# Patient Record
Sex: Male | Born: 1935 | Race: White | Hispanic: No | Smoking: Never smoker
Health system: Southern US, Community
[De-identification: ages and names within clinical notes are randomized; demographics above are authoritative.]

## PROBLEM LIST (undated history)

## (undated) DIAGNOSIS — E669 Obesity, unspecified: Secondary | ICD-10-CM

## (undated) DIAGNOSIS — K648 Other hemorrhoids: Secondary | ICD-10-CM

## (undated) DIAGNOSIS — E785 Hyperlipidemia, unspecified: Secondary | ICD-10-CM

## (undated) DIAGNOSIS — E039 Hypothyroidism, unspecified: Secondary | ICD-10-CM

## (undated) DIAGNOSIS — K5730 Diverticulosis of large intestine without perforation or abscess without bleeding: Secondary | ICD-10-CM

## (undated) DIAGNOSIS — M179 Osteoarthritis of knee, unspecified: Secondary | ICD-10-CM

## (undated) DIAGNOSIS — M1710 Unilateral primary osteoarthritis, unspecified knee: Secondary | ICD-10-CM

## (undated) HISTORY — DX: Hyperlipidemia, unspecified: E78.5

## (undated) HISTORY — DX: Diverticulosis of large intestine without perforation or abscess without bleeding: K57.30

## (undated) HISTORY — DX: Osteoarthritis of knee, unspecified: M17.9

## (undated) HISTORY — DX: Other hemorrhoids: K64.8

## (undated) HISTORY — DX: Unilateral primary osteoarthritis, unspecified knee: M17.10

## (undated) HISTORY — DX: Obesity, unspecified: E66.9

## (undated) HISTORY — DX: Hypothyroidism, unspecified: E03.9

---

## 2003-07-11 ENCOUNTER — Encounter: Admission: RE | Admit: 2003-07-11 | Discharge: 2003-07-11 | Payer: Self-pay | Admitting: Internal Medicine

## 2008-06-23 ENCOUNTER — Ambulatory Visit: Payer: Self-pay | Admitting: Vascular Surgery

## 2008-12-23 ENCOUNTER — Ambulatory Visit: Payer: Self-pay | Admitting: Internal Medicine

## 2008-12-23 DIAGNOSIS — K5730 Diverticulosis of large intestine without perforation or abscess without bleeding: Secondary | ICD-10-CM

## 2008-12-23 HISTORY — DX: Diverticulosis of large intestine without perforation or abscess without bleeding: K57.30

## 2009-01-05 ENCOUNTER — Ambulatory Visit: Payer: Self-pay | Admitting: Internal Medicine

## 2009-05-01 ENCOUNTER — Ambulatory Visit: Payer: Self-pay | Admitting: Surgery

## 2009-10-03 ENCOUNTER — Encounter (INDEPENDENT_AMBULATORY_CARE_PROVIDER_SITE_OTHER): Payer: Self-pay | Admitting: Internal Medicine

## 2009-10-03 ENCOUNTER — Ambulatory Visit: Payer: Self-pay | Admitting: Internal Medicine

## 2009-10-03 ENCOUNTER — Ambulatory Visit: Payer: Self-pay | Admitting: Vascular Surgery

## 2009-10-03 ENCOUNTER — Inpatient Hospital Stay (HOSPITAL_COMMUNITY): Admission: EM | Admit: 2009-10-03 | Discharge: 2009-10-13 | Payer: Self-pay | Admitting: Emergency Medicine

## 2009-10-06 ENCOUNTER — Encounter: Payer: Self-pay | Admitting: Critical Care Medicine

## 2009-10-14 ENCOUNTER — Inpatient Hospital Stay (HOSPITAL_COMMUNITY): Admission: EM | Admit: 2009-10-14 | Discharge: 2009-10-20 | Payer: Self-pay | Admitting: Emergency Medicine

## 2009-10-17 ENCOUNTER — Encounter (HOSPITAL_BASED_OUTPATIENT_CLINIC_OR_DEPARTMENT_OTHER): Payer: Self-pay | Admitting: Internal Medicine

## 2009-12-15 ENCOUNTER — Inpatient Hospital Stay (HOSPITAL_COMMUNITY): Admission: AD | Admit: 2009-12-15 | Discharge: 2009-12-21 | Payer: Self-pay | Admitting: Internal Medicine

## 2009-12-16 ENCOUNTER — Encounter (INDEPENDENT_AMBULATORY_CARE_PROVIDER_SITE_OTHER): Payer: Self-pay | Admitting: Internal Medicine

## 2009-12-16 ENCOUNTER — Ambulatory Visit: Payer: Self-pay | Admitting: Vascular Surgery

## 2009-12-16 ENCOUNTER — Ambulatory Visit: Payer: Self-pay | Admitting: Infectious Diseases

## 2010-10-11 LAB — GLUCOSE, CAPILLARY
Glucose-Capillary: 104 mg/dL — ABNORMAL HIGH (ref 70–99)
Glucose-Capillary: 107 mg/dL — ABNORMAL HIGH (ref 70–99)
Glucose-Capillary: 108 mg/dL — ABNORMAL HIGH (ref 70–99)
Glucose-Capillary: 109 mg/dL — ABNORMAL HIGH (ref 70–99)
Glucose-Capillary: 109 mg/dL — ABNORMAL HIGH (ref 70–99)
Glucose-Capillary: 110 mg/dL — ABNORMAL HIGH (ref 70–99)
Glucose-Capillary: 110 mg/dL — ABNORMAL HIGH (ref 70–99)
Glucose-Capillary: 112 mg/dL — ABNORMAL HIGH (ref 70–99)
Glucose-Capillary: 112 mg/dL — ABNORMAL HIGH (ref 70–99)
Glucose-Capillary: 126 mg/dL — ABNORMAL HIGH (ref 70–99)
Glucose-Capillary: 130 mg/dL — ABNORMAL HIGH (ref 70–99)
Glucose-Capillary: 146 mg/dL — ABNORMAL HIGH (ref 70–99)

## 2010-10-11 LAB — CBC
HCT: 26.3 % — ABNORMAL LOW (ref 39.0–52.0)
HCT: 28.4 % — ABNORMAL LOW (ref 39.0–52.0)
HCT: 29.6 % — ABNORMAL LOW (ref 39.0–52.0)
HCT: 42.7 % (ref 39.0–52.0)
Hemoglobin: 10.2 g/dL — ABNORMAL LOW (ref 13.0–17.0)
Hemoglobin: 14 g/dL (ref 13.0–17.0)
Hemoglobin: 8.9 g/dL — ABNORMAL LOW (ref 13.0–17.0)
Hemoglobin: 9 g/dL — ABNORMAL LOW (ref 13.0–17.0)
Hemoglobin: 9.8 g/dL — ABNORMAL LOW (ref 13.0–17.0)
MCHC: 32.8 g/dL (ref 30.0–36.0)
MCHC: 34.3 g/dL (ref 30.0–36.0)
MCHC: 34.3 g/dL (ref 30.0–36.0)
MCHC: 34.3 g/dL (ref 30.0–36.0)
MCHC: 34.4 g/dL (ref 30.0–36.0)
MCHC: 34.4 g/dL (ref 30.0–36.0)
MCHC: 34.5 g/dL (ref 30.0–36.0)
MCV: 89.3 fL (ref 78.0–100.0)
MCV: 89.3 fL (ref 78.0–100.0)
MCV: 93.1 fL (ref 78.0–100.0)
Platelets: 174 10*3/uL (ref 150–400)
Platelets: 193 10*3/uL (ref 150–400)
RBC: 2.87 MIL/uL — ABNORMAL LOW (ref 4.22–5.81)
RBC: 2.95 MIL/uL — ABNORMAL LOW (ref 4.22–5.81)
RBC: 3.08 MIL/uL — ABNORMAL LOW (ref 4.22–5.81)
RBC: 3.18 MIL/uL — ABNORMAL LOW (ref 4.22–5.81)
RBC: 3.46 MIL/uL — ABNORMAL LOW (ref 4.22–5.81)
RBC: 4.58 MIL/uL (ref 4.22–5.81)
RDW: 13.7 % (ref 11.5–15.5)
RDW: 17.2 % — ABNORMAL HIGH (ref 11.5–15.5)
RDW: 17.3 % — ABNORMAL HIGH (ref 11.5–15.5)
WBC: 4.7 10*3/uL (ref 4.0–10.5)
WBC: 5.4 10*3/uL (ref 4.0–10.5)
WBC: 6.8 10*3/uL (ref 4.0–10.5)
WBC: 9.8 10*3/uL (ref 4.0–10.5)

## 2010-10-11 LAB — COMPREHENSIVE METABOLIC PANEL
ALT: 22 U/L (ref 0–53)
AST: 27 U/L (ref 0–37)
Albumin: 3 g/dL — ABNORMAL LOW (ref 3.5–5.2)
Alkaline Phosphatase: 37 U/L — ABNORMAL LOW (ref 39–117)
BUN: 44 mg/dL — ABNORMAL HIGH (ref 6–23)
CO2: 25 mEq/L (ref 19–32)
Calcium: 8.6 mg/dL (ref 8.4–10.5)
Chloride: 104 mEq/L (ref 96–112)
Creatinine, Ser: 2.82 mg/dL — ABNORMAL HIGH (ref 0.4–1.5)
GFR calc Af Amer: 27 mL/min — ABNORMAL LOW (ref >60)
GFR calc non Af Amer: 22 mL/min — ABNORMAL LOW (ref >60)
Glucose, Bld: 128 mg/dL — ABNORMAL HIGH (ref 70–99)
Potassium: 3.6 mEq/L (ref 3.5–5.1)
Sodium: 135 mEq/L (ref 135–145)
Total Bilirubin: 0.8 mg/dL (ref 0.3–1.2)
Total Protein: 7.3 g/dL (ref 6.0–8.3)

## 2010-10-11 LAB — BASIC METABOLIC PANEL
BUN: 40 mg/dL — ABNORMAL HIGH (ref 6–23)
CO2: 21 mEq/L (ref 19–32)
CO2: 22 mEq/L (ref 19–32)
CO2: 25 mEq/L (ref 19–32)
CO2: 26 mEq/L (ref 19–32)
CO2: 26 mEq/L (ref 19–32)
Calcium: 8 mg/dL — ABNORMAL LOW (ref 8.4–10.5)
Calcium: 8.3 mg/dL — ABNORMAL LOW (ref 8.4–10.5)
Calcium: 8.5 mg/dL (ref 8.4–10.5)
Calcium: 8.7 mg/dL (ref 8.4–10.5)
Chloride: 106 mEq/L (ref 96–112)
Chloride: 109 mEq/L (ref 96–112)
Creatinine, Ser: 1.43 mg/dL (ref 0.4–1.5)
Creatinine, Ser: 1.52 mg/dL — ABNORMAL HIGH (ref 0.4–1.5)
Creatinine, Ser: 2.49 mg/dL — ABNORMAL HIGH (ref 0.4–1.5)
GFR calc Af Amer: 31 mL/min — ABNORMAL LOW (ref >60)
GFR calc Af Amer: 45 mL/min — ABNORMAL LOW (ref >60)
GFR calc Af Amer: 55 mL/min — ABNORMAL LOW (ref >60)
GFR calc Af Amer: 56 mL/min — ABNORMAL LOW (ref >60)
GFR calc Af Amer: 59 mL/min — ABNORMAL LOW (ref >60)
GFR calc non Af Amer: 26 mL/min — ABNORMAL LOW (ref >60)
GFR calc non Af Amer: 37 mL/min — ABNORMAL LOW (ref >60)
GFR calc non Af Amer: 48 mL/min — ABNORMAL LOW (ref >60)
Glucose, Bld: 102 mg/dL — ABNORMAL HIGH (ref 70–99)
Glucose, Bld: 102 mg/dL — ABNORMAL HIGH (ref 70–99)
Glucose, Bld: 111 mg/dL — ABNORMAL HIGH (ref 70–99)
Potassium: 3.5 mEq/L (ref 3.5–5.1)
Potassium: 3.7 mEq/L (ref 3.5–5.1)
Potassium: 3.7 mEq/L (ref 3.5–5.1)
Sodium: 135 mEq/L (ref 135–145)
Sodium: 138 mEq/L (ref 135–145)
Sodium: 139 mEq/L (ref 135–145)
Sodium: 140 mEq/L (ref 135–145)

## 2010-10-11 LAB — CULTURE, BLOOD (ROUTINE X 2)
Culture: NO GROWTH
Culture: NO GROWTH

## 2010-10-11 LAB — VANCOMYCIN, TROUGH: Vancomycin Tr: 50.1 ug/mL (ref 10.0–20.0)

## 2010-10-18 LAB — CBC
HCT: 24.1 % — ABNORMAL LOW (ref 39.0–52.0)
HCT: 25.3 % — ABNORMAL LOW (ref 39.0–52.0)
HCT: 25.8 % — ABNORMAL LOW (ref 39.0–52.0)
HCT: 26 % — ABNORMAL LOW (ref 39.0–52.0)
HCT: 27.4 % — ABNORMAL LOW (ref 39.0–52.0)
Hemoglobin: 11 g/dL — ABNORMAL LOW (ref 13.0–17.0)
Hemoglobin: 8 g/dL — ABNORMAL LOW (ref 13.0–17.0)
Hemoglobin: 8.1 g/dL — ABNORMAL LOW (ref 13.0–17.0)
Hemoglobin: 8.7 g/dL — ABNORMAL LOW (ref 13.0–17.0)
Hemoglobin: 8.9 g/dL — ABNORMAL LOW (ref 13.0–17.0)
Hemoglobin: 9.1 g/dL — ABNORMAL LOW (ref 13.0–17.0)
MCHC: 32.3 g/dL (ref 30.0–36.0)
MCHC: 31.6 g/dL (ref 30.0–36.0)
MCHC: 33.3 g/dL (ref 30.0–36.0)
MCHC: 33.4 g/dL (ref 30.0–36.0)
MCHC: 33.5 g/dL (ref 30.0–36.0)
MCHC: 33.6 g/dL (ref 30.0–36.0)
MCHC: 34.1 g/dL (ref 30.0–36.0)
MCHC: 34.3 g/dL (ref 30.0–36.0)
MCHC: 34.5 g/dL (ref 30.0–36.0)
MCHC: 34.7 g/dL (ref 30.0–36.0)
MCHC: 34.8 g/dL (ref 30.0–36.0)
MCV: 83.4 fL (ref 78.0–100.0)
MCV: 83.5 fL (ref 78.0–100.0)
MCV: 83.9 fL (ref 78.0–100.0)
MCV: 84.6 fL (ref 78.0–100.0)
MCV: 84.7 fL (ref 78.0–100.0)
MCV: 84.8 fL (ref 78.0–100.0)
MCV: 84.8 fL (ref 78.0–100.0)
MCV: 85.3 fL (ref 78.0–100.0)
Platelets: 123 10*3/uL — ABNORMAL LOW (ref 150–400)
Platelets: 194 10*3/uL (ref 150–400)
Platelets: 100 10*3/uL — ABNORMAL LOW (ref 150–400)
Platelets: 104 10*3/uL — ABNORMAL LOW (ref 150–400)
Platelets: 305 10*3/uL (ref 150–400)
Platelets: 331 10*3/uL (ref 150–400)
Platelets: 407 10*3/uL — ABNORMAL HIGH (ref 150–400)
Platelets: 413 10*3/uL — ABNORMAL HIGH (ref 150–400)
RBC: 3.94 MIL/uL — ABNORMAL LOW (ref 4.22–5.81)
RBC: 2.79 MIL/uL — ABNORMAL LOW (ref 4.22–5.81)
RBC: 2.85 MIL/uL — ABNORMAL LOW (ref 4.22–5.81)
RBC: 2.88 MIL/uL — ABNORMAL LOW (ref 4.22–5.81)
RBC: 3.02 MIL/uL — ABNORMAL LOW (ref 4.22–5.81)
RBC: 3.04 MIL/uL — ABNORMAL LOW (ref 4.22–5.81)
RBC: 3.22 MIL/uL — ABNORMAL LOW (ref 4.22–5.81)
RBC: 3.29 MIL/uL — ABNORMAL LOW (ref 4.22–5.81)
RDW: 15 % (ref 11.5–15.5)
RDW: 14.9 % (ref 11.5–15.5)
RDW: 14.9 % (ref 11.5–15.5)
RDW: 14.9 % (ref 11.5–15.5)
RDW: 14.9 % (ref 11.5–15.5)
RDW: 15.3 % (ref 11.5–15.5)
RDW: 15.5 % (ref 11.5–15.5)
RDW: 15.5 % (ref 11.5–15.5)
WBC: 11.3 10*3/uL — ABNORMAL HIGH (ref 4.0–10.5)
WBC: 18.4 10*3/uL — ABNORMAL HIGH (ref 4.0–10.5)
WBC: 10.8 10*3/uL — ABNORMAL HIGH (ref 4.0–10.5)
WBC: 12.3 10*3/uL — ABNORMAL HIGH (ref 4.0–10.5)
WBC: 12.5 10*3/uL — ABNORMAL HIGH (ref 4.0–10.5)
WBC: 6.3 10*3/uL (ref 4.0–10.5)
WBC: 8 10*3/uL (ref 4.0–10.5)

## 2010-10-18 LAB — DIFFERENTIAL
Band Neutrophils: 0 % (ref 0–10)
Basophils Absolute: 0.1 10*3/uL (ref 0.0–0.1)
Basophils Absolute: 0 10*3/uL (ref 0.0–0.1)
Basophils Absolute: 0 10*3/uL (ref 0.0–0.1)
Basophils Relative: 0 % (ref 0–1)
Basophils Relative: 0 % (ref 0–1)
Basophils Relative: 0 % (ref 0–1)
Basophils Relative: 0 % (ref 0–1)
Blasts: 0 %
Eosinophils Absolute: 1.5 10*3/uL — ABNORMAL HIGH (ref 0.0–0.7)
Eosinophils Absolute: 3 10*3/uL — ABNORMAL HIGH (ref 0.0–0.7)
Eosinophils Relative: 0 % (ref 0–5)
Eosinophils Relative: 16 % — ABNORMAL HIGH (ref 0–5)
Eosinophils Relative: 22 % — ABNORMAL HIGH (ref 0–5)
Eosinophils Relative: 39 % — ABNORMAL HIGH (ref 0–5)
Lymphocytes Relative: 13 % (ref 12–46)
Lymphocytes Relative: 14 % (ref 12–46)
Lymphocytes Relative: 6 % — ABNORMAL LOW (ref 12–46)
Lymphocytes Relative: 7 % — ABNORMAL LOW (ref 12–46)
Lymphs Abs: 0.6 10*3/uL — ABNORMAL LOW (ref 0.7–4.0)
Lymphs Abs: 0.7 10*3/uL (ref 0.7–4.0)
Lymphs Abs: 1 10*3/uL (ref 0.7–4.0)
Lymphs Abs: 1.5 10*3/uL (ref 0.7–4.0)
Lymphs Abs: 1.9 10*3/uL (ref 0.7–4.0)
Metamyelocytes Relative: 0 %
Monocytes Absolute: 0.4 10*3/uL (ref 0.1–1.0)
Monocytes Absolute: 0.3 10*3/uL (ref 0.1–1.0)
Monocytes Absolute: 0.3 10*3/uL (ref 0.1–1.0)
Monocytes Absolute: 0.6 10*3/uL (ref 0.1–1.0)
Monocytes Relative: 2 % — ABNORMAL LOW (ref 3–12)
Monocytes Relative: 4 % (ref 3–12)
Monocytes Relative: 6 % (ref 3–12)
Neutro Abs: 3.9 10*3/uL (ref 1.7–7.7)
Neutro Abs: 4.2 10*3/uL (ref 1.7–7.7)
Neutrophils Relative %: 36 % — ABNORMAL LOW (ref 43–77)
Neutrophils Relative %: 44 % (ref 43–77)
Neutrophils Relative %: 60 % (ref 43–77)
Neutrophils Relative %: 74 % (ref 43–77)
Neutrophils Relative %: 82 % — ABNORMAL HIGH (ref 43–77)
Promyelocytes Absolute: 0 %
WBC Morphology: INCREASED
nRBC: 0 /100 WBC

## 2010-10-18 LAB — BLOOD GAS, ARTERIAL
Acid-Base Excess: 1.2 mmol/L (ref 0.0–2.0)
Bicarbonate: 17.6 mEq/L — ABNORMAL LOW (ref 20.0–24.0)
Drawn by: 308601
O2 Content: 3 L/min
O2 Content: 3 L/min
O2 Saturation: 96.2 %
Patient temperature: 98.6
TCO2: 19.7 mmol/L (ref 0–100)
pH, Arterial: 7.421 (ref 7.350–7.450)
pO2, Arterial: 106 mmHg — ABNORMAL HIGH (ref 80.0–100.0)
pO2, Arterial: 85.9 mmHg (ref 80.0–100.0)

## 2010-10-18 LAB — COMPREHENSIVE METABOLIC PANEL
ALT: 93 U/L — ABNORMAL HIGH (ref 0–53)
ALT: 118 U/L — ABNORMAL HIGH (ref 0–53)
ALT: 138 U/L — ABNORMAL HIGH (ref 0–53)
ALT: 19 U/L (ref 0–53)
ALT: 22 U/L (ref 0–53)
ALT: 26 U/L (ref 0–53)
ALT: 31 U/L (ref 0–53)
AST: 304 U/L — ABNORMAL HIGH (ref 0–37)
AST: 468 U/L — ABNORMAL HIGH (ref 0–37)
AST: 123 U/L — ABNORMAL HIGH (ref 0–37)
AST: 201 U/L — ABNORMAL HIGH (ref 0–37)
AST: 21 U/L (ref 0–37)
AST: 28 U/L (ref 0–37)
AST: 35 U/L (ref 0–37)
AST: 37 U/L (ref 0–37)
AST: 475 U/L — ABNORMAL HIGH (ref 0–37)
Albumin: 2 g/dL — ABNORMAL LOW (ref 3.5–5.2)
Albumin: 2.7 g/dL — ABNORMAL LOW (ref 3.5–5.2)
Albumin: 1.4 g/dL — ABNORMAL LOW (ref 3.5–5.2)
Albumin: 1.4 g/dL — ABNORMAL LOW (ref 3.5–5.2)
Albumin: 1.5 g/dL — ABNORMAL LOW (ref 3.5–5.2)
Albumin: 1.5 g/dL — ABNORMAL LOW (ref 3.5–5.2)
Albumin: 1.6 g/dL — ABNORMAL LOW (ref 3.5–5.2)
Alkaline Phosphatase: 37 U/L — ABNORMAL LOW (ref 39–117)
Alkaline Phosphatase: 44 U/L (ref 39–117)
Alkaline Phosphatase: 45 U/L (ref 39–117)
Alkaline Phosphatase: 11 U/L — ABNORMAL LOW (ref 39–117)
Alkaline Phosphatase: 40 U/L (ref 39–117)
Alkaline Phosphatase: 42 U/L (ref 39–117)
Alkaline Phosphatase: 61 U/L (ref 39–117)
BUN: 39 mg/dL — ABNORMAL HIGH (ref 6–23)
BUN: 49 mg/dL — ABNORMAL HIGH (ref 6–23)
BUN: 68 mg/dL — ABNORMAL HIGH (ref 6–23)
BUN: 73 mg/dL — ABNORMAL HIGH (ref 6–23)
CO2: 21 mEq/L (ref 19–32)
CO2: 25 mEq/L (ref 19–32)
CO2: 25 mEq/L (ref 19–32)
CO2: 26 mEq/L (ref 19–32)
CO2: 27 mEq/L (ref 19–32)
CO2: 32 mEq/L (ref 19–32)
Calcium: 5.8 mg/dL — CL (ref 8.4–10.5)
Calcium: 6.9 mg/dL — ABNORMAL LOW (ref 8.4–10.5)
Calcium: 6.9 mg/dL — ABNORMAL LOW (ref 8.4–10.5)
Calcium: 7 mg/dL — ABNORMAL LOW (ref 8.4–10.5)
Calcium: 7.1 mg/dL — ABNORMAL LOW (ref 8.4–10.5)
Calcium: 7.4 mg/dL — ABNORMAL LOW (ref 8.4–10.5)
Chloride: 102 mEq/L (ref 96–112)
Chloride: 108 mEq/L (ref 96–112)
Chloride: 110 mEq/L (ref 96–112)
Chloride: 106 mEq/L (ref 96–112)
Chloride: 107 mEq/L (ref 96–112)
Chloride: 108 mEq/L (ref 96–112)
Chloride: 95 mEq/L — ABNORMAL LOW (ref 96–112)
Creatinine, Ser: 3.5 mg/dL — ABNORMAL HIGH (ref 0.4–1.5)
Creatinine, Ser: 4.19 mg/dL — ABNORMAL HIGH (ref 0.4–1.5)
Creatinine, Ser: 4.55 mg/dL — ABNORMAL HIGH (ref 0.4–1.5)
Creatinine, Ser: 4.77 mg/dL — ABNORMAL HIGH (ref 0.4–1.5)
Creatinine, Ser: 4.85 mg/dL — ABNORMAL HIGH (ref 0.4–1.5)
Creatinine, Ser: 5.1 mg/dL — ABNORMAL HIGH (ref 0.4–1.5)
Creatinine, Ser: 5.27 mg/dL — ABNORMAL HIGH (ref 0.4–1.5)
GFR calc Af Amer: 16 mL/min — ABNORMAL LOW (ref >60)
GFR calc Af Amer: 17 mL/min — ABNORMAL LOW (ref >60)
GFR calc Af Amer: 13 mL/min — ABNORMAL LOW (ref >60)
GFR calc Af Amer: 13 mL/min — ABNORMAL LOW (ref >60)
GFR calc Af Amer: 15 mL/min — ABNORMAL LOW (ref >60)
GFR calc Af Amer: 15 mL/min — ABNORMAL LOW (ref >60)
GFR calc Af Amer: 17 mL/min — ABNORMAL LOW (ref >60)
GFR calc Af Amer: 18 mL/min — ABNORMAL LOW (ref >60)
GFR calc Af Amer: 21 mL/min — ABNORMAL LOW (ref >60)
GFR calc non Af Amer: 13 mL/min — ABNORMAL LOW (ref >60)
GFR calc non Af Amer: 11 mL/min — ABNORMAL LOW (ref >60)
GFR calc non Af Amer: 11 mL/min — ABNORMAL LOW (ref >60)
GFR calc non Af Amer: 12 mL/min — ABNORMAL LOW (ref >60)
GFR calc non Af Amer: 12 mL/min — ABNORMAL LOW (ref >60)
GFR calc non Af Amer: 13 mL/min — ABNORMAL LOW (ref >60)
GFR calc non Af Amer: 14 mL/min — ABNORMAL LOW (ref >60)
GFR calc non Af Amer: 17 mL/min — ABNORMAL LOW (ref >60)
Glucose, Bld: 113 mg/dL — ABNORMAL HIGH (ref 70–99)
Glucose, Bld: 109 mg/dL — ABNORMAL HIGH (ref 70–99)
Glucose, Bld: 87 mg/dL (ref 70–99)
Glucose, Bld: 94 mg/dL (ref 70–99)
Potassium: 3.5 mEq/L (ref 3.5–5.1)
Potassium: 3.8 mEq/L (ref 3.5–5.1)
Potassium: 3.8 mEq/L (ref 3.5–5.1)
Potassium: 3.3 mEq/L — ABNORMAL LOW (ref 3.5–5.1)
Potassium: 4.6 mEq/L (ref 3.5–5.1)
Potassium: 4.8 mEq/L (ref 3.5–5.1)
Potassium: 4.9 mEq/L (ref 3.5–5.1)
Sodium: 140 mEq/L (ref 135–145)
Sodium: 140 mEq/L (ref 135–145)
Sodium: 141 mEq/L (ref 135–145)
Sodium: 137 mEq/L (ref 135–145)
Sodium: 138 mEq/L (ref 135–145)
Sodium: 138 mEq/L (ref 135–145)
Sodium: 139 mEq/L (ref 135–145)
Sodium: 139 mEq/L (ref 135–145)
Sodium: 139 mEq/L (ref 135–145)
Total Bilirubin: 1 mg/dL (ref 0.3–1.2)
Total Bilirubin: 1 mg/dL (ref 0.3–1.2)
Total Bilirubin: 0.1 mg/dL — ABNORMAL LOW (ref 0.3–1.2)
Total Bilirubin: 0.6 mg/dL (ref 0.3–1.2)
Total Bilirubin: 0.7 mg/dL (ref 0.3–1.2)
Total Bilirubin: 0.7 mg/dL (ref 0.3–1.2)
Total Protein: 6 g/dL (ref 6.0–8.3)
Total Protein: 6.7 g/dL (ref 6.0–8.3)
Total Protein: 5.5 g/dL — ABNORMAL LOW (ref 6.0–8.3)
Total Protein: 6 g/dL (ref 6.0–8.3)
Total Protein: 6.3 g/dL (ref 6.0–8.3)
Total Protein: 6.6 g/dL (ref 6.0–8.3)
Total Protein: 7 g/dL (ref 6.0–8.3)
Total Protein: <3 g/dL — ABNORMAL LOW (ref 6.0–8.3)

## 2010-10-18 LAB — CLOSTRIDIUM DIFFICILE EIA

## 2010-10-18 LAB — BASIC METABOLIC PANEL
BUN: 54 mg/dL — ABNORMAL HIGH (ref 6–23)
BUN: 73 mg/dL — ABNORMAL HIGH (ref 6–23)
CO2: 20 mEq/L (ref 19–32)
CO2: 25 mEq/L (ref 19–32)
CO2: 24 mEq/L (ref 19–32)
CO2: 31 mEq/L (ref 19–32)
CO2: 31 mEq/L (ref 19–32)
Calcium: 5.5 mg/dL — CL (ref 8.4–10.5)
Calcium: 7.4 mg/dL — ABNORMAL LOW (ref 8.4–10.5)
Chloride: 103 mEq/L (ref 96–112)
Chloride: 111 mEq/L (ref 96–112)
Chloride: 103 mEq/L (ref 96–112)
Chloride: 96 mEq/L (ref 96–112)
Chloride: 96 mEq/L (ref 96–112)
Creatinine, Ser: 4.32 mg/dL — ABNORMAL HIGH (ref 0.4–1.5)
Creatinine, Ser: 4.35 mg/dL — ABNORMAL HIGH (ref 0.4–1.5)
Creatinine, Ser: 4.49 mg/dL — ABNORMAL HIGH (ref 0.4–1.5)
Creatinine, Ser: 5.13 mg/dL — ABNORMAL HIGH (ref 0.4–1.5)
GFR calc Af Amer: 16 mL/min — ABNORMAL LOW (ref >60)
GFR calc Af Amer: 16 mL/min — ABNORMAL LOW (ref >60)
GFR calc Af Amer: 14 mL/min — ABNORMAL LOW (ref >60)
GFR calc Af Amer: 16 mL/min — ABNORMAL LOW (ref >60)
GFR calc non Af Amer: 11 mL/min — ABNORMAL LOW (ref >60)
GFR calc non Af Amer: 12 mL/min — ABNORMAL LOW (ref >60)
GFR calc non Af Amer: 13 mL/min — ABNORMAL LOW (ref >60)
Glucose, Bld: 113 mg/dL — ABNORMAL HIGH (ref 70–99)
Glucose, Bld: 95 mg/dL (ref 70–99)
Potassium: 3.4 mEq/L — ABNORMAL LOW (ref 3.5–5.1)
Potassium: 3.6 mEq/L (ref 3.5–5.1)
Potassium: 3.4 mEq/L — ABNORMAL LOW (ref 3.5–5.1)
Potassium: 3.6 mEq/L (ref 3.5–5.1)
Potassium: 4.1 mEq/L (ref 3.5–5.1)
Potassium: 4.5 mEq/L (ref 3.5–5.1)
Sodium: 139 mEq/L (ref 135–145)
Sodium: 137 mEq/L (ref 135–145)
Sodium: 139 mEq/L (ref 135–145)

## 2010-10-18 LAB — CK TOTAL AND CKMB (NOT AT ARMC)
CK, MB: 1.5 ng/mL (ref 0.3–4.0)
Relative Index: 0.1 (ref 0.0–2.5)
Relative Index: 0 (ref 0.0–2.5)

## 2010-10-18 LAB — GLUCOSE, CAPILLARY
Glucose-Capillary: 106 mg/dL — ABNORMAL HIGH (ref 70–99)
Glucose-Capillary: 120 mg/dL — ABNORMAL HIGH (ref 70–99)
Glucose-Capillary: 154 mg/dL — ABNORMAL HIGH (ref 70–99)
Glucose-Capillary: 97 mg/dL (ref 70–99)
Glucose-Capillary: 103 mg/dL — ABNORMAL HIGH (ref 70–99)
Glucose-Capillary: 103 mg/dL — ABNORMAL HIGH (ref 70–99)
Glucose-Capillary: 105 mg/dL — ABNORMAL HIGH (ref 70–99)
Glucose-Capillary: 106 mg/dL — ABNORMAL HIGH (ref 70–99)
Glucose-Capillary: 107 mg/dL — ABNORMAL HIGH (ref 70–99)
Glucose-Capillary: 107 mg/dL — ABNORMAL HIGH (ref 70–99)
Glucose-Capillary: 109 mg/dL — ABNORMAL HIGH (ref 70–99)
Glucose-Capillary: 114 mg/dL — ABNORMAL HIGH (ref 70–99)
Glucose-Capillary: 115 mg/dL — ABNORMAL HIGH (ref 70–99)
Glucose-Capillary: 117 mg/dL — ABNORMAL HIGH (ref 70–99)
Glucose-Capillary: 120 mg/dL — ABNORMAL HIGH (ref 70–99)
Glucose-Capillary: 122 mg/dL — ABNORMAL HIGH (ref 70–99)
Glucose-Capillary: 125 mg/dL — ABNORMAL HIGH (ref 70–99)
Glucose-Capillary: 127 mg/dL — ABNORMAL HIGH (ref 70–99)
Glucose-Capillary: 127 mg/dL — ABNORMAL HIGH (ref 70–99)
Glucose-Capillary: 128 mg/dL — ABNORMAL HIGH (ref 70–99)
Glucose-Capillary: 132 mg/dL — ABNORMAL HIGH (ref 70–99)
Glucose-Capillary: 151 mg/dL — ABNORMAL HIGH (ref 70–99)
Glucose-Capillary: 181 mg/dL — ABNORMAL HIGH (ref 70–99)
Glucose-Capillary: 185 mg/dL — ABNORMAL HIGH (ref 70–99)
Glucose-Capillary: 84 mg/dL (ref 70–99)
Glucose-Capillary: 85 mg/dL (ref 70–99)
Glucose-Capillary: 88 mg/dL (ref 70–99)
Glucose-Capillary: 89 mg/dL (ref 70–99)
Glucose-Capillary: 95 mg/dL (ref 70–99)
Glucose-Capillary: 97 mg/dL (ref 70–99)

## 2010-10-18 LAB — POCT I-STAT, CHEM 8
BUN: 48 mg/dL — ABNORMAL HIGH (ref 6–23)
Chloride: 108 mEq/L (ref 96–112)
Creatinine, Ser: 5.5 mg/dL — ABNORMAL HIGH (ref 0.4–1.5)
Glucose, Bld: 101 mg/dL — ABNORMAL HIGH (ref 70–99)
Hemoglobin: 9.5 g/dL — ABNORMAL LOW (ref 13.0–17.0)
Potassium: 4.7 mEq/L (ref 3.5–5.1)
Sodium: 136 mEq/L (ref 135–145)

## 2010-10-18 LAB — LACTIC ACID, PLASMA
Lactic Acid, Venous: 1.4 mmol/L (ref 0.5–2.2)
Lactic Acid, Venous: 1.4 mmol/L (ref 0.5–2.2)

## 2010-10-18 LAB — CARDIAC PANEL(CRET KIN+CKTOT+MB+TROPI)
CK, MB: 3.6 ng/mL (ref 0.3–4.0)
CK, MB: 4.5 ng/mL — ABNORMAL HIGH (ref 0.3–4.0)
CK, MB: 2.6 ng/mL (ref 0.3–4.0)
Relative Index: 0 (ref 0.0–2.5)
Total CK: 29251 U/L — ABNORMAL HIGH (ref 7–232)
Total CK: 39549 U/L — ABNORMAL HIGH (ref 7–232)
Total CK: 41325 U/L — ABNORMAL HIGH (ref 7–232)
Total CK: 37848 U/L — ABNORMAL HIGH (ref 7–232)
Troponin I: 0.38 ng/mL — ABNORMAL HIGH (ref 0.00–0.06)
Troponin I: 0.49 ng/mL — ABNORMAL HIGH (ref 0.00–0.06)
Troponin I: 0.58 ng/mL (ref 0.00–0.06)
Troponin I: 0.83 ng/mL (ref 0.00–0.06)
Troponin I: 1.15 ng/mL (ref 0.00–0.06)
Troponin I: 0.51 ng/mL (ref 0.00–0.06)

## 2010-10-18 LAB — URINALYSIS, ROUTINE W REFLEX MICROSCOPIC
Nitrite: NEGATIVE
Protein, ur: 100 mg/dL — AB
Specific Gravity, Urine: 1.026 (ref 1.005–1.030)
Urobilinogen, UA: 0.2 mg/dL (ref 0.0–1.0)

## 2010-10-18 LAB — RENAL FUNCTION PANEL
Albumin: 1.3 g/dL — ABNORMAL LOW (ref 3.5–5.2)
Albumin: 1.4 g/dL — ABNORMAL LOW (ref 3.5–5.2)
BUN: 63 mg/dL — ABNORMAL HIGH (ref 6–23)
BUN: 65 mg/dL — ABNORMAL HIGH (ref 6–23)
Calcium: 7.2 mg/dL — ABNORMAL LOW (ref 8.4–10.5)
Chloride: 100 mEq/L (ref 96–112)
Glucose, Bld: 138 mg/dL — ABNORMAL HIGH (ref 70–99)
Phosphorus: 3.5 mg/dL (ref 2.3–4.6)
Potassium: 3.7 mEq/L (ref 3.5–5.1)
Potassium: 3.9 mEq/L (ref 3.5–5.1)
Sodium: 135 mEq/L (ref 135–145)
Sodium: 137 mEq/L (ref 135–145)

## 2010-10-18 LAB — HEPATIC FUNCTION PANEL
ALT: 142 U/L — ABNORMAL HIGH (ref 0–53)
Alkaline Phosphatase: 49 U/L (ref 39–117)
Bilirubin, Direct: 0.2 mg/dL (ref 0.0–0.3)
Indirect Bilirubin: 0.8 mg/dL (ref 0.3–0.9)
Total Protein: 5.9 g/dL — ABNORMAL LOW (ref 6.0–8.3)

## 2010-10-18 LAB — CULTURE, BLOOD (ROUTINE X 2): Culture: NO GROWTH

## 2010-10-18 LAB — VITAMIN B12: Vitamin B-12: 328 pg/mL (ref 211–911)

## 2010-10-18 LAB — APTT: aPTT: 33 seconds (ref 24–37)

## 2010-10-18 LAB — FERRITIN: Ferritin: 196 ng/mL (ref 22–322)

## 2010-10-18 LAB — PHOSPHORUS
Phosphorus: 3.9 mg/dL (ref 2.3–4.6)
Phosphorus: 4.2 mg/dL (ref 2.3–4.6)
Phosphorus: 4.4 mg/dL (ref 2.3–4.6)

## 2010-10-18 LAB — URINE CULTURE
Colony Count: NO GROWTH
Culture: NO GROWTH

## 2010-10-18 LAB — CK
Total CK: 43468 U/L — ABNORMAL HIGH (ref 7–232)
Total CK: 1746 U/L — ABNORMAL HIGH (ref 7–232)
Total CK: 205 U/L (ref 7–232)
Total CK: 4893 U/L — ABNORMAL HIGH (ref 7–232)

## 2010-10-18 LAB — HEMOGLOBIN A1C: Mean Plasma Glucose: 126 mg/dL

## 2010-10-18 LAB — VANCOMYCIN, RANDOM: Vancomycin Rm: 9.8 ug/mL

## 2010-10-18 LAB — IRON AND TIBC
Iron: 13 ug/dL — ABNORMAL LOW (ref 42–135)
Saturation Ratios: 9 % — ABNORMAL LOW (ref 20–55)
TIBC: 127 ug/dL — ABNORMAL LOW (ref 215–435)
UIBC: 105 ug/dL

## 2010-10-18 LAB — RETICULOCYTES: Retic Ct Pct: 1.5 % (ref 0.4–3.1)

## 2010-10-18 LAB — PROTIME-INR: Prothrombin Time: 16.9 seconds — ABNORMAL HIGH (ref 11.6–15.2)

## 2010-10-18 LAB — URINE MICROSCOPIC-ADD ON

## 2010-10-18 LAB — MRSA PCR SCREENING
MRSA by PCR: NEGATIVE
MRSA by PCR: NEGATIVE

## 2010-10-18 LAB — CALCIUM, IONIZED
Calcium, Ion: 0.69 mmol/L — CL (ref 1.12–1.32)
Calcium, Ion: 1.04 mmol/L — ABNORMAL LOW (ref 1.12–1.32)

## 2010-11-01 LAB — GLUCOSE, CAPILLARY
Glucose-Capillary: 91 mg/dL (ref 70–99)
Glucose-Capillary: 94 mg/dL (ref 70–99)

## 2010-12-07 NOTE — Procedures (Signed)
DUPLEX DEEP VENOUS EXAM - LOWER EXTREMITY   INDICATION:  Right lower extremity pain and swelling.   HISTORY:  Edema:  Yes.  Trauma/Surgery:  No.  Pain:  Yes.  PE:  No.  Previous DVT:  No.  Anticoagulants:  No.  Other:  No.   DUPLEX EXAM:                CFV   SFV   PopV  PTV    GSV                R  L  R  L  R  L  R   L  R  L  Thrombosis    0  0  0     0     0      0  Spontaneous   +  +  +     +     +      +  Phasic        +  +  +     +     +      +  Augmentation  +  +  +     +     +      +  Compressible  +  +  +     +     +      +  Competent     +  +  +     +     +      +   Legend:  + - yes  o - no  p - partial  D - decreased   IMPRESSION:  1. No evidence of deep venous thrombosis noted in the right leg.  2. Notified [facility] with results.    _____________________________  V. Charlena Cross, MD   MG/MEDQ  D:  05/01/2009  T:  05/02/2009  Job:  [id number]

## 2010-12-07 NOTE — Procedures (Signed)
DUPLEX DEEP VENOUS EXAM - LOWER EXTREMITY   INDICATION:  Left lower extremity pain and swelling.   HISTORY:  Edema:  Yes.  Trauma/Surgery:  No.  Pain:  Yes.  PE:  No.  Previous DVT:  No.  Anticoagulants:  None.  Other:   DUPLEX EXAM:                CFV   SFV   PopV  PTV    GSV                R  L  R  L  R  L  R   L  R  L  Thrombosis    o  o     o     o      o     o  Spontaneous   +  +     +     +      +     +  Phasic        +  +     +     +      +     +  Augmentation  +  +     +     +      +     +  Compressible  +  +     +     +      +     +  Competent     +  +     +     +      +     +   Legend:  + - yes  o - no  p - partial  D - decreased   IMPRESSION:  1. No evidence of deep venous thrombosis noted in the left leg.  2. An enlarged lymph node noted in the left groin region.    _____________________________  Janetta Hora Fields, MD   MG/MEDQ  D:  06/23/2008  T:  06/23/2008  Job:  [id number]

## 2011-09-18 IMAGING — CT CT ABD-PELV W/O CM
2 of 5 series · 14 of 36 positions shown, 18 images · non-contrast
Comparison: None

CT CHEST

CLINICAL DATA: Rhabdomyolysis.  Renal failure.

CT CHEST, ABDOMEN AND PELVIS WITHOUT CONTRAST
TECHNIQUE: Multidetector CT imaging of the chest, abdomen and
pelvis was performed following the standard protocol without IV
contrast.

[Series 2: chest_routine 5.0 b40f st · axial · 0.93mm/px · z∈[-1453,-168]mm · 13 of 290 slices shown, 17 images]
[im 22/290  mediastinal]
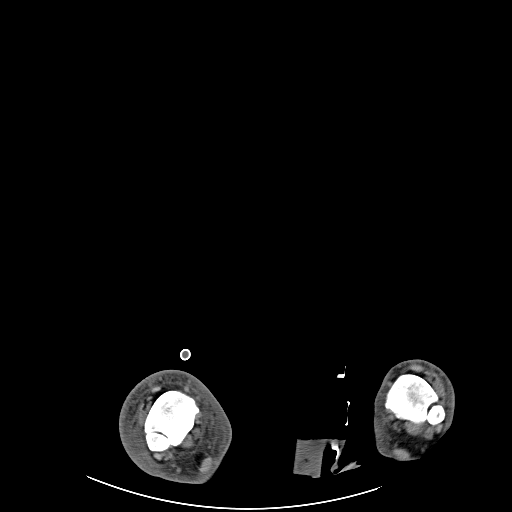
[im 22/290  lung]
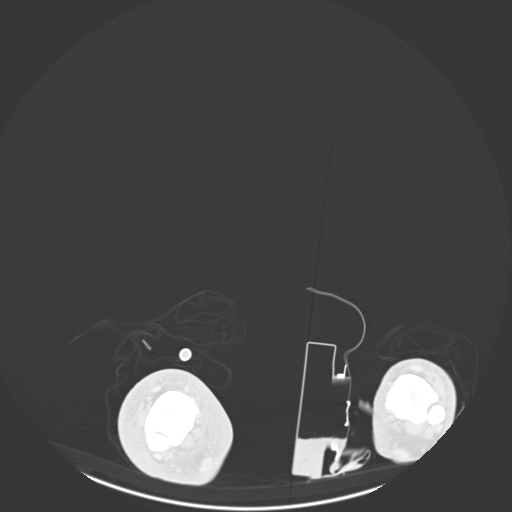
[im 43/290  lung]
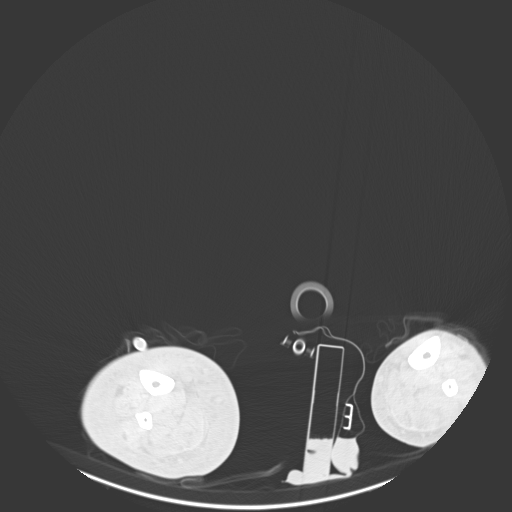
[im 65/290  lung]
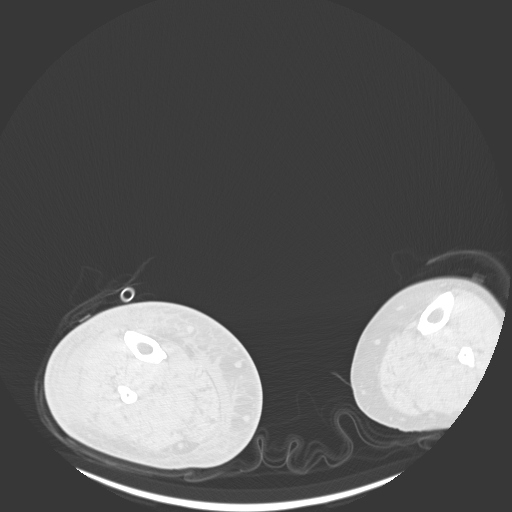
[im 86/290  lung]
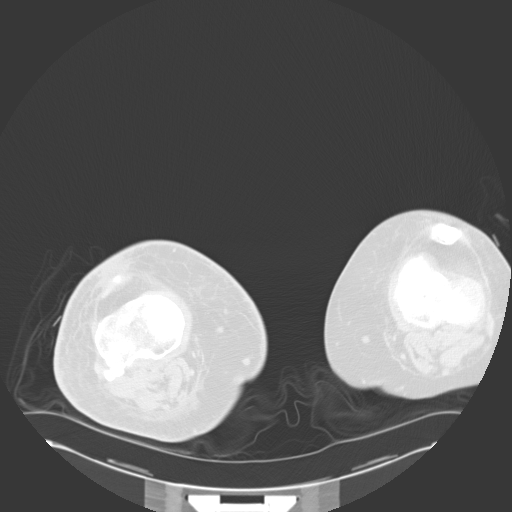
[im 108/290  mediastinal]
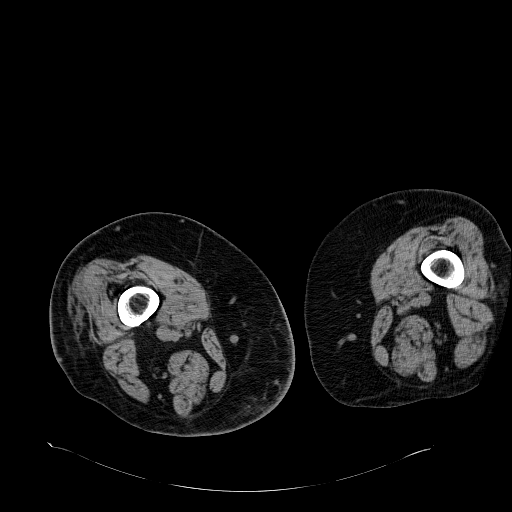
[im 108/290  lung]
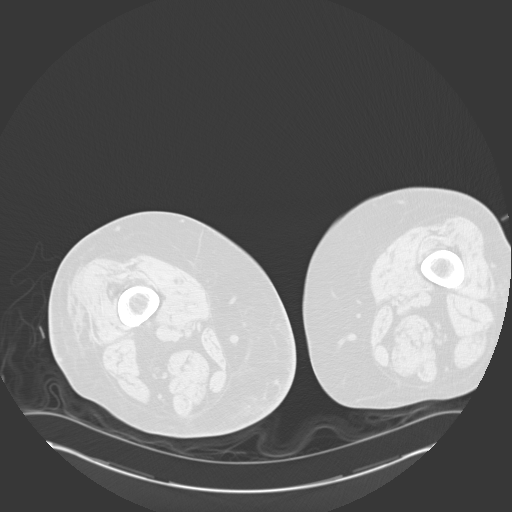
[im 129/290  lung]
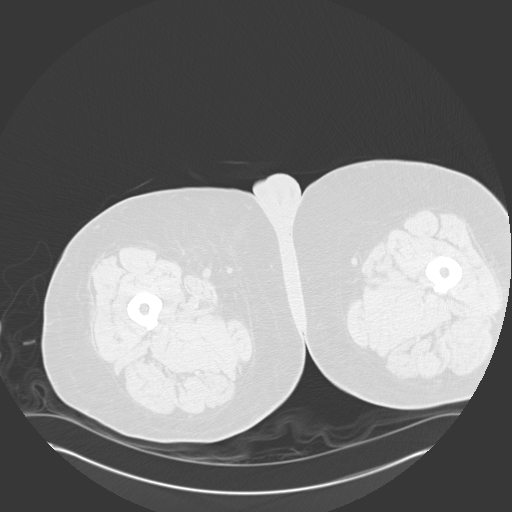
[im 150/290  lung]
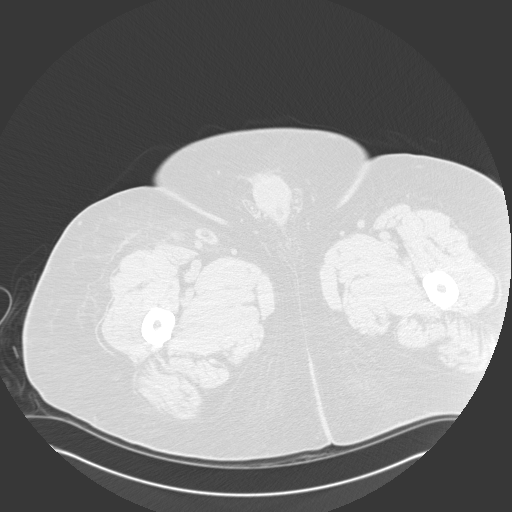
[im 172/290  lung]
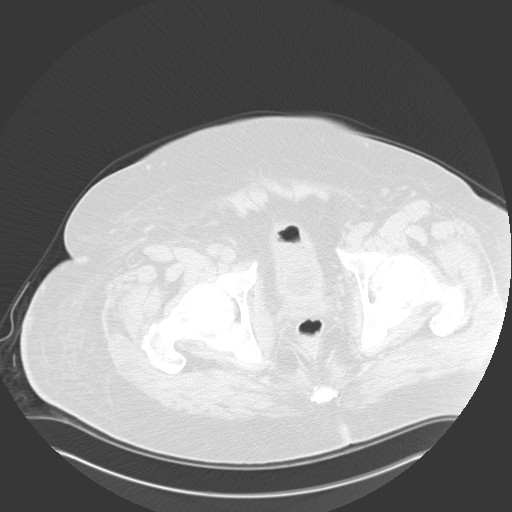
[im 193/290  mediastinal]
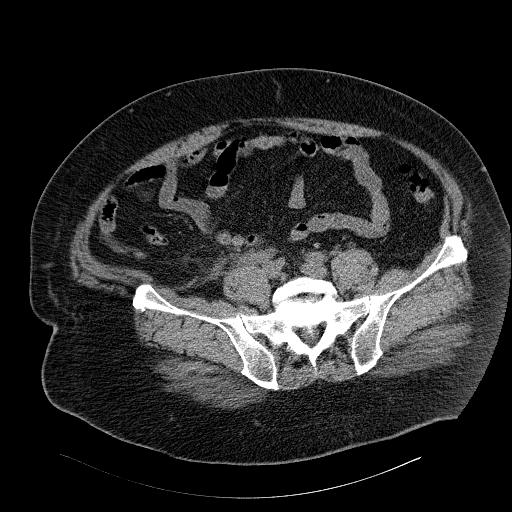
[im 193/290  lung]
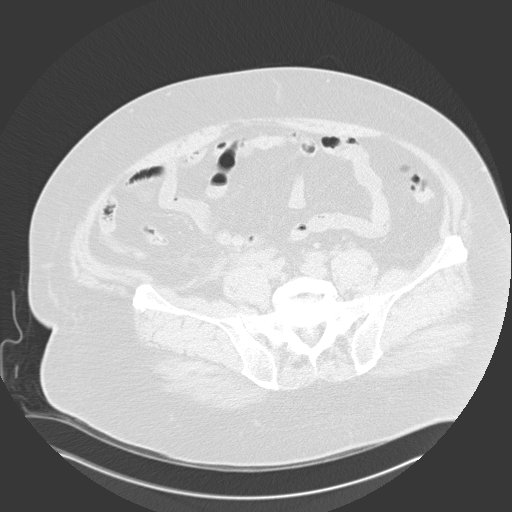
[im 215/290  lung]
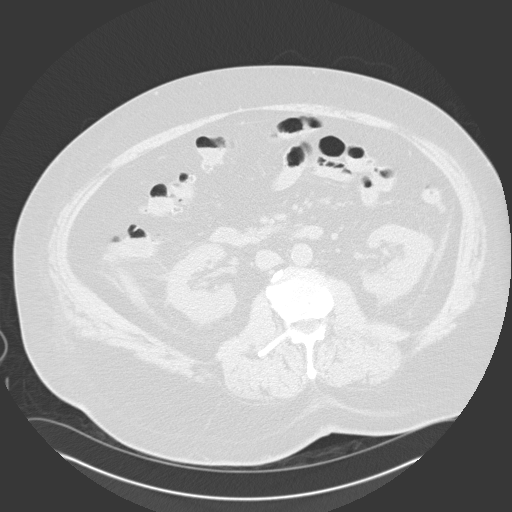
[im 236/290  lung]
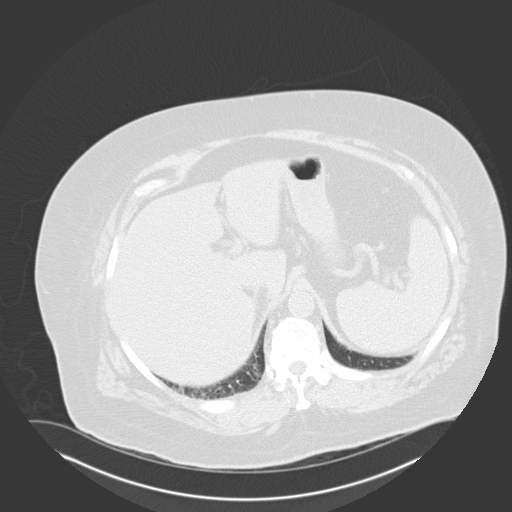
[im 257/290  lung]
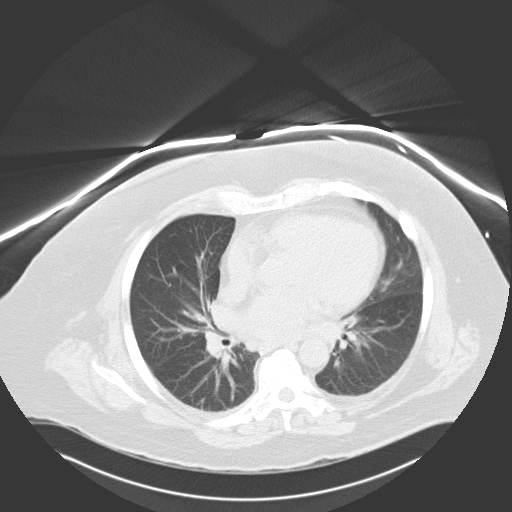
[im 279/290  mediastinal]
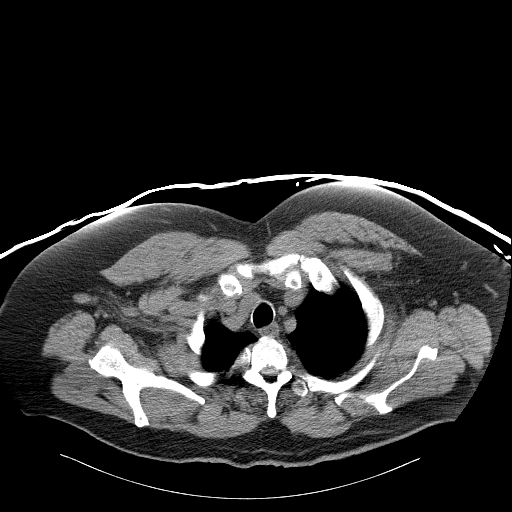
[im 279/290  lung]
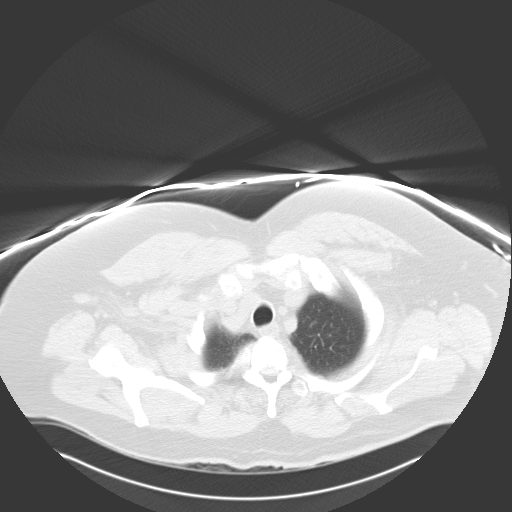

[Series 602: coronal cap · coronal · 1.33mm/px · 1 of 187 slices shown]
[im 94/187  lung]
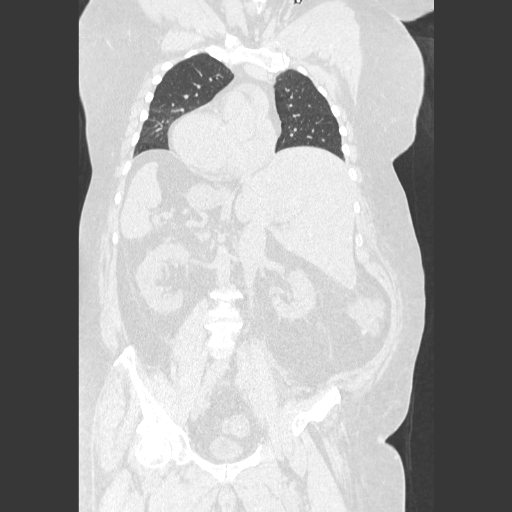

[14 of 36 positions shown; findings below may reference images not displayed]

FINDINGS: The chest wall is unremarkable.  No supraclavicular or
axillary adenopathy.  The bony thorax is intact.  No destructive
bony changes or spinal canal compromise.  Mild degenerative
changes.

The heart is upper limits of normal in size.  No pericardial
effusion.  No mediastinal or hilar adenopathy.  Coronary artery
calcifications are noted.  The aorta is normal in caliber.  Mild
enlargement of the pulmonary arteries may be due to pulmonary
hypertension.  The esophagus is grossly normal.

Examination of the lung parenchyma demonstrates streaky areas of
atelectasis but no focal infiltrates, edema or effusions.  No
worrisome mass lesions.  The tracheobronchial tree is grossly
normal.
IMPRESSION: Unremarkable noncontrast CT examination of the chest.

CT ABDOMEN AND PELVIS
FINDINGS: The unenhanced appearance of the liver and spleen are
unremarkable.  The pancreas is normal.  The adrenal glands and
kidneys are unremarkable.  The gallbladder is normal.  No biliary
dilatation.

The stomach, duodenum, small bowel and colon demonstrate no
significant abnormalities.  There are scattered colonic
diverticuli.

Mild inflammatory change in the small amount of fluid noted in both
paracolic gutters of uncertain significance.  Scattered nodes noted
along the right iliac chain.  The appendix is normal.  No
significant free pelvic fluid collections.  The rectum and sigmoid
colon are unremarkable except for diverticulosis. There is a Foley
catheter in a decompressed bladder.

There are enlarged right inguinal lymph nodes and nonspecific
inflammatory changes in the right inguinal area and mild cellulitic
change in the upper right thigh.

The bony structures are unremarkable.
IMPRESSION: 1.  Minimal nonspecific edema like changes and a small amount of
fluid in the paracolic gutters.
2.  Diffuse scattered colonic diverticulosis.
3.  Interstitial changes and enlarged right inguinal lymph nodes.

## 2011-09-21 IMAGING — CR DG ABD PORTABLE 1V
2 series · 2 of 2 positions shown · non-contrast
Comparison: CT abdomen pelvis 10/04/2009

CLINICAL DATA: Abdominal distention and loose stools.  Evaluate for
ileus.

ABDOMEN - 1 VIEW

[view not recorded (1 of 2)]
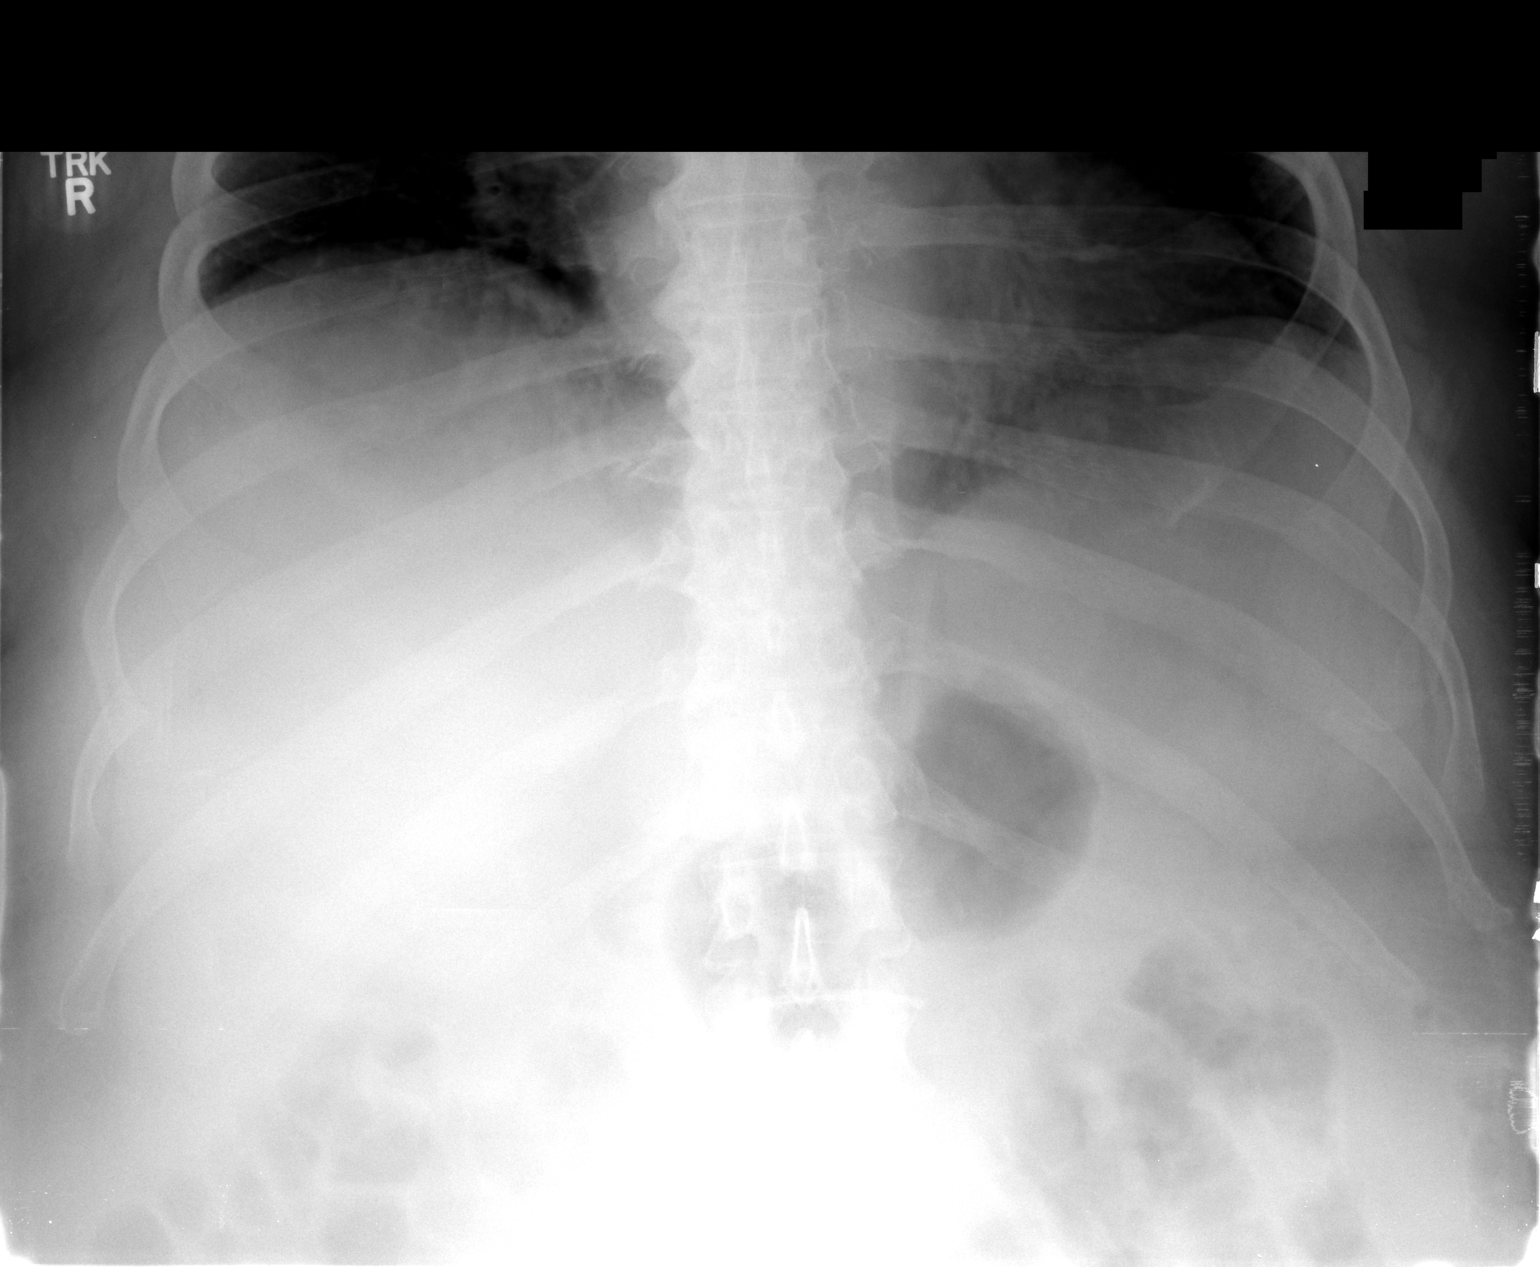

[view not recorded (2 of 2)]
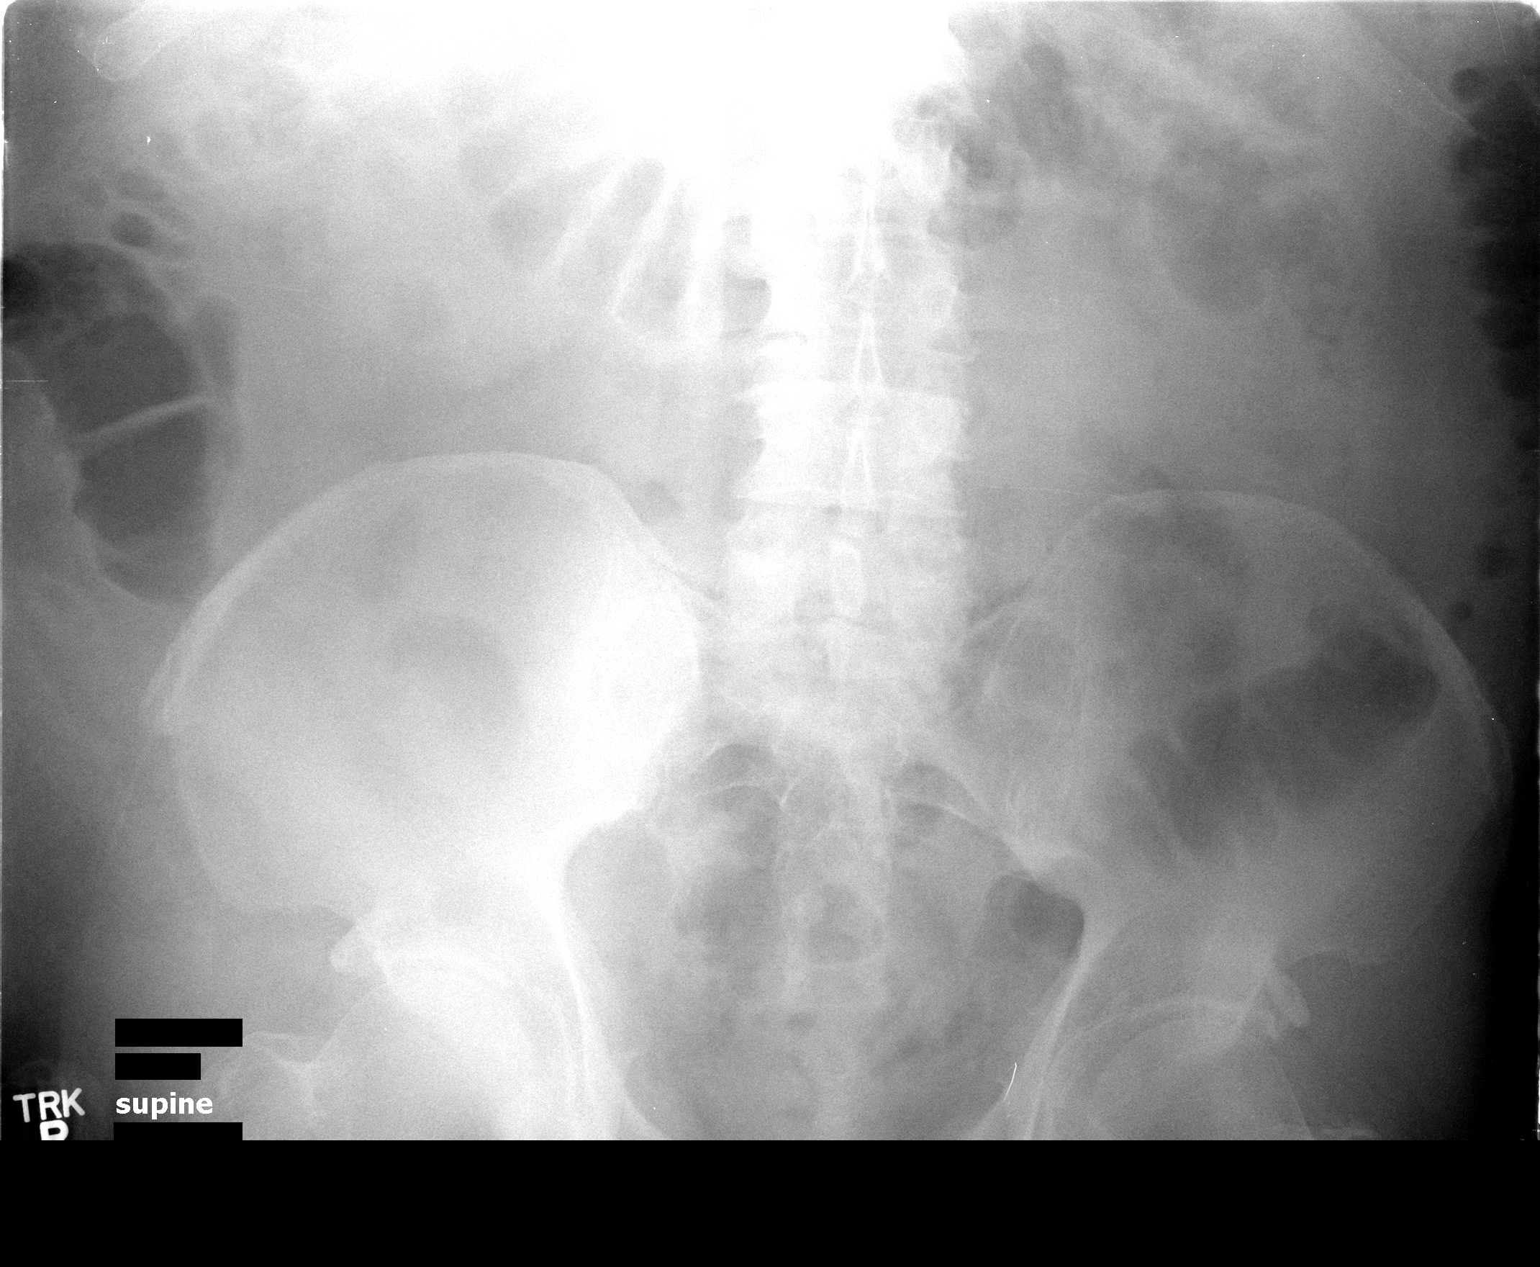

[2 of 2 positions shown; findings below may reference images not displayed]

FINDINGS: Visualized lung bases show cardiomegaly.  No
consolidation or effusion.

Gas is seen within nondistended colon and a few nondistended small
bowel loops.  No significant amount of stool.  No abdominal mass
effect. There are degenerative changes of the lumbar spine.
IMPRESSION: Nonobstructive bowel gas pattern.  No definite radiographic
evidence of ileus or obstruction.

## 2013-01-24 ENCOUNTER — Telehealth: Payer: Self-pay | Admitting: Internal Medicine

## 2013-01-24 NOTE — Telephone Encounter (Signed)
Left message for George Cortez to call back.  

## 2013-01-28 NOTE — Telephone Encounter (Signed)
Pt scheduled to see Willette Cluster NP tomorrow at [facility] to notify pt and fax records.

## 2013-01-29 ENCOUNTER — Encounter: Payer: Self-pay | Admitting: *Deleted

## 2013-01-29 ENCOUNTER — Encounter: Payer: Self-pay | Admitting: Nurse Practitioner

## 2013-01-29 ENCOUNTER — Ambulatory Visit (INDEPENDENT_AMBULATORY_CARE_PROVIDER_SITE_OTHER): Payer: Medicare Other | Admitting: Nurse Practitioner

## 2013-01-29 VITALS — BP 120/74 | HR 101 | Wt 333.4 lb

## 2013-01-29 DIAGNOSIS — K625 Hemorrhage of anus and rectum: Secondary | ICD-10-CM

## 2013-01-29 NOTE — Patient Instructions (Addendum)
Finish the course of rectal suppositories you have at home.  Call us if you have any recurrent bleeding.

## 2013-01-29 NOTE — Progress Notes (Signed)
HPI :  Patient is a 79 year old male known to Dr Marina Goodell. He had a screening colonoscopy June 2010 with findings of severe diverticulosis and internal hemorrhoids. Bowel prep was good, extent of exam to the cecum. Next colonoscopy due 2020.   Patient referred for recent gastrointestinal bleeding . Patient typically has 2 solid bowel movements a day . Last week he developed acute bloody diarrhea. No associated abdominal pain. No nausea or fever. Over a 24-hour period patient had 6 to 7 episodes of bloody diarrhea. No sick contacts. No unusual foods. He had traveled to [facility] 3 days prior to the onset of symptoms but no out of country travel . By the time patient saw PCP last week bleeding had already subsided. Patient feels okay now but he does recall feeling weak after the blood loss.  Etiology felt by PCP to be hemorrhoidal or diverticular. Daily ASA put on hold. Hemorrhoid suppositories were prescribed, patient has 4 days left of treatment . No recurrent bleeding and BMs have returned to baseline. Hgb was 9.2 at PCPs office last week which according to the PCP represents a slight decrease from baseline .Patient has since been started on iron. WBC was normal .    Past Medical History  Diagnosis Date  . OA (osteoarthritis) of knee     Both knees  . Hypothyroidism   . Diverticulosis of colon 12/2008  . Internal hemorrhoids   . Obesity   . Dyslipidemia     Family History  Problem Relation Age of Onset  . Osteoarthritis Mother   . Cancer Maternal Grandmother     Unknows kind of cancer   History  Substance Use Topics  . Smoking status: Never Smoker   . Smokeless tobacco: Never Used  . Alcohol Use: No   Current Outpatient Prescriptions  Medication Sig Dispense Refill  . Ergocalciferol (VITAMIN D2) 2000 UNITS TABS Take by mouth daily.      . fenofibrate (TRICOR) 145 MG tablet Take 145 mg by mouth daily.      . furosemide (LASIX) 10 MG/ML solution Take by mouth daily.      Marland Kitchen HYDROCORTISONE  ACE, RECTAL, (PROCTOCORT) 30 MG SUPP Place 1 suppository rectally. Use 1 suppository twice daily.      Marland Kitchen levothyroxine (SYNTHROID) 200 MCG tablet Take 200 mcg by mouth daily before breakfast.      . Multiple Vitamins-Minerals (CENTRUM) tablet Take 1 tablet by mouth daily.      . niacin (NIASPAN) 1000 MG CR tablet Take 1,000 mg by mouth 2 (two) times daily.      . promethazine (PHENERGAN) 25 MG tablet Take 25 mg by mouth every 6 (six) hours as needed for nausea.      . Vitamin D, Ergocalciferol, (DRISDOL) 50000 UNITS CAPS Take 50,000 Units by mouth every 7 (seven) days. Take 1 tab       No current facility-administered medications for this visit.   No Known Allergies  Review of Systems: All systems reviewed and negative except where noted in HPI.   Physical Exam: BP 120/74  Pulse 101  Wt 333 lb 6.4 oz (151.229 kg)  SpO2 98% Constitutional: Pleasant,obese white male in no acute distress. HEENT: Normocephalic and atraumatic. Conjunctivae are normal. No scleral icterus. Neck supple.  Cardiovascular: Normal rate, regular rhythm.  Pulmonary/chest: Effort normal and breath sounds normal. No wheezing, rales or rhonchi. Abdominal: Soft, nondistended, nontender. Bowel sounds active throughout. There are no masses palpable. No hepatomegaly. Rectal: no external lesions. On  anoscopic view there were some small internal hemorrhoids.  Extremities: BLE edema (2+ LLE, 3+ RLE) Neurological: Alert and oriented to person place and time. Skin: Skin is warm and dry. No rashes noted. Psychiatric: Normal mood and affect. Behavior is normal.   ASSESSMENT AND PLAN: 1. Acute bloody, loose stool, resolved. Last week patient developed acute bloody diarrhea without associated abdominal pain, fever, nausea or leukocytosis. This may have been infectious. Bleeding may have been part of infectious process or secondary to irritated hemorrhoids. Diverticular hemorrhage is strong possibility, doubt ischemia in absence  of pain / leukocytosis.  No bleeding in 6 days now and bowel movements are back to normal. If patient has anymore episodes of recurrent bleeding he will call our office ASAP.  2. Chronic anemia with recent drop in hgb to 9.2 ( I don't have recent labs to compare) but fell below baseline after bleeding per PCP notes. Started on BID iron at recent PCP office. Chronic kidney disease likely contributing to chronic anemia.

## 2013-01-30 NOTE — Progress Notes (Signed)
Agree with initial assessment and plans 

## 2013-02-04 ENCOUNTER — Encounter: Payer: Self-pay | Admitting: Internal Medicine

## 2013-10-16 ENCOUNTER — Ambulatory Visit: Payer: Self-pay | Admitting: Ophthalmology

## 2014-03-25 DEATH — deceased

## 2014-11-15 NOTE — Op Note (Signed)
PATIENT NAME:  George Cortez, George Cortez MR#:  [id number] DATE OF BIRTH:  08-09-1933  DATE OF PROCEDURE:  10/16/2013  PROCEDURES PERFORMED:  1.  Pars plana vitrectomy of the right eye.  2.  Internal limiting membrane peel of the right eye.  3.  Gas exchange of the right eye.   PREOPERATIVE DIAGNOSES: Full-thickness macular hole, right eye.   POSTOPERATIVE DIAGNOSIS: Full-thickness macular hole, right eye.   ESTIMATED BLOOD LOSS: Less than 1 mL.  PRIMARY SURGEON: Aron BabaMatthew Moe Brier, M.D.   ANESTHESIA: Retrobulbar block of the right eye with monitored anesthesia care.   COMPLICATIONS: None.   INDICATIONS FOR PROCEDURE: This patient presented to my office with decreased vision in the right eye. Examination revealed a full-thickness macular hole of the right eye. Risks, benefits, and alternatives of the above procedure were discussed and the patient wished to proceed.   DETAILS OF PROCEDURE: After informed consent was obtained, the patient was brought into the operative suite [facility]. The patient was placed in supine position, was given a small dose of Alfenta and a retrobulbar block of the right eye was performed by the primary surgeon without any complications. The right eye was prepped and draped in sterile manner. After lid speculum was inserted, a 25-gauge trocar was placed inferotemporally through displaced conjunctiva in an oblique fashion 3 mm beyond the limbus. The infusion cannula was turned on and inserted through the trocar and secured in position with Steri-Strips. Two more trocars were placed in a similar fashion superotemporally and superonasally. Vitreous cutter and light pipe were introduced in the eye and a core vitrectomy was performed. The vitreous base was confirmed as already elevated. The peripheral vitreous was trimmed for 360 degrees. Indocyanine green was injected onto the posterior pole and removed within 30 seconds. An internal limiting membrane peel was  performed for 360 degrees around the fovea without any complication for a total diameter of 2 disk diameters. A scleral depressed exam was performed for 360 degrees and no signs of any breaks, tears, or retinal detachment could be identified. A full air-fluid exchange was performed. Four minutes was allowed to elapse for dehydration of the vitreous base. This remnant fluid was removed. 22% SF6 was used as an Nurse, children'sair gas exchange. The superotemporal wound was noted to be leaky and covered with blood. A small Cortez-shaped conjunctival peritomy was created and the sclerotomy was closed using 7-0 Vicryl. The conjunctiva was closed using running 6-0 plain gut. The other trocars were noted to be airtight. Pressure in the eye was confirmed to be approximately 15 mmHg. Dexamethasone 5 mg was given into the inferior fornix and the lid speculum was removed. The eye was cleaned and TobraDex was placed in the eye. A patch and shield were placed over the eye and the patient was taken to postanesthesia care with instructions to remain face down.    ____________________________ Ignacia FellingMatthew F. Champ MungoAppenzeller, MD mfa:aw D: 10/16/2013 10:29:06 ET T: 10/16/2013 11:23:38 ET JOB#: [id number]  cc: Ignacia FellingMatthew F. Champ MungoAppenzeller, MD, <Dictator> Cline CoolsMATTHEW F Dhana Totton MD ELECTRONICALLY SIGNED 10/30/2013 8:53

## 2015-01-06 ENCOUNTER — Encounter: Payer: Self-pay | Admitting: Internal Medicine
